# Patient Record
Sex: Female | Born: 1990 | Race: White | Hispanic: No | Marital: Single | Smoking: Current every day smoker
Health system: Southern US, Community
[De-identification: ages and names within clinical notes are randomized; demographics above are authoritative.]

## PROBLEM LIST (undated history)

## (undated) DIAGNOSIS — F1120 Opioid dependence, uncomplicated: Secondary | ICD-10-CM

## (undated) HISTORY — PX: NO PAST SURGERIES: SHX2092

---

## 2017-08-31 ENCOUNTER — Inpatient Hospital Stay
Admission: EM | Admit: 2017-08-31 | Payer: Self-pay | Source: Other Acute Inpatient Hospital | Admitting: Obstetrics and Gynecology

## 2017-08-31 ENCOUNTER — Other Ambulatory Visit: Payer: Self-pay

## 2017-08-31 ENCOUNTER — Inpatient Hospital Stay (HOSPITAL_COMMUNITY): Payer: Self-pay

## 2017-08-31 ENCOUNTER — Encounter (HOSPITAL_COMMUNITY): Payer: Self-pay

## 2017-08-31 ENCOUNTER — Inpatient Hospital Stay (HOSPITAL_COMMUNITY)
Admission: AD | Admit: 2017-08-31 | Discharge: 2017-08-31 | DRG: 761 | Disposition: A | Discharge status: Home / Self Care | Payer: Self-pay | Source: Ambulatory Visit | Attending: Obstetrics and Gynecology | Admitting: Obstetrics and Gynecology

## 2017-08-31 DIAGNOSIS — N83209 Unspecified ovarian cyst, unspecified side: Secondary | ICD-10-CM | POA: Diagnosis present

## 2017-08-31 DIAGNOSIS — F17210 Nicotine dependence, cigarettes, uncomplicated: Secondary | ICD-10-CM | POA: Diagnosis present

## 2017-08-31 DIAGNOSIS — D279 Benign neoplasm of unspecified ovary: Secondary | ICD-10-CM

## 2017-08-31 DIAGNOSIS — R1032 Left lower quadrant pain: Secondary | ICD-10-CM | POA: Diagnosis present

## 2017-08-31 DIAGNOSIS — N83202 Unspecified ovarian cyst, left side: Principal | ICD-10-CM | POA: Diagnosis present

## 2017-08-31 DIAGNOSIS — N801 Endometriosis of ovary: Secondary | ICD-10-CM | POA: Diagnosis present

## 2017-08-31 LAB — COMPREHENSIVE METABOLIC PANEL
ALT: 24 U/L (ref 14–54)
ANION GAP: 7 (ref 5–15)
AST: 20 U/L (ref 15–41)
Albumin: 3.2 g/dL — ABNORMAL LOW (ref 3.5–5.0)
Alkaline Phosphatase: 33 U/L — ABNORMAL LOW (ref 38–126)
BUN: 10 mg/dL (ref 6–20)
CHLORIDE: 112 mmol/L — AB (ref 101–111)
CO2: 19 mmol/L — ABNORMAL LOW (ref 22–32)
Calcium: 7.8 mg/dL — ABNORMAL LOW (ref 8.9–10.3)
Creatinine, Ser: 0.46 mg/dL (ref 0.44–1.00)
GFR calc non Af Amer: >60 mL/min (ref >60)
Glucose, Bld: 85 mg/dL (ref 65–99)
POTASSIUM: 3.1 mmol/L — AB (ref 3.5–5.1)
SODIUM: 138 mmol/L (ref 135–145)
Total Bilirubin: 0.6 mg/dL (ref 0.3–1.2)
Total Protein: 5.6 g/dL — ABNORMAL LOW (ref 6.5–8.1)

## 2017-08-31 LAB — CBC WITH DIFFERENTIAL/PLATELET
BASOS ABS: 0 10*3/uL (ref 0.0–0.1)
Basophils Relative: 0 %
Eosinophils Absolute: 0.1 10*3/uL (ref 0.0–0.7)
Eosinophils Relative: 1 %
HCT: 31 % — ABNORMAL LOW (ref 36.0–46.0)
HEMOGLOBIN: 10.9 g/dL — AB (ref 12.0–15.0)
LYMPHS ABS: 4.5 10*3/uL — AB (ref 0.7–4.0)
LYMPHS PCT: 38 %
MCH: 29.9 pg (ref 26.0–34.0)
MCHC: 35.2 g/dL (ref 30.0–36.0)
MCV: 84.9 fL (ref 78.0–100.0)
Monocytes Absolute: 0.6 10*3/uL (ref 0.1–1.0)
Monocytes Relative: 5 %
NEUTROS PCT: 56 %
Neutro Abs: 6.6 10*3/uL (ref 1.7–7.7)
Platelets: 263 10*3/uL (ref 150–400)
RBC: 3.65 MIL/uL — AB (ref 3.87–5.11)
RDW: 15.8 % — ABNORMAL HIGH (ref 11.5–15.5)
WBC: 11.8 10*3/uL — AB (ref 4.0–10.5)

## 2017-08-31 LAB — URINALYSIS, ROUTINE W REFLEX MICROSCOPIC
Bilirubin Urine: NEGATIVE
GLUCOSE, UA: NEGATIVE mg/dL
Hgb urine dipstick: NEGATIVE
KETONES UR: 5 mg/dL — AB
NITRITE: NEGATIVE
PROTEIN: NEGATIVE mg/dL
Specific Gravity, Urine: 1.025 (ref 1.005–1.030)
pH: 5 (ref 5.0–8.0)

## 2017-08-31 LAB — RAPID URINE DRUG SCREEN, HOSP PERFORMED
AMPHETAMINES: NOT DETECTED
BENZODIAZEPINES: NOT DETECTED
Barbiturates: NOT DETECTED
COCAINE: NOT DETECTED
OPIATES: POSITIVE — AB
Tetrahydrocannabinol: POSITIVE — AB

## 2017-08-31 LAB — HCG, QUANTITATIVE, PREGNANCY: hCG, Beta Chain, Quant, S: <1 m[IU]/mL (ref <5)

## 2017-08-31 LAB — LIPASE, BLOOD: LIPASE: 27 U/L (ref 11–51)

## 2017-08-31 LAB — TYPE AND SCREEN
ABO/RH(D): A POS
Antibody Screen: NEGATIVE

## 2017-08-31 LAB — SEDIMENTATION RATE: SED RATE: 4 mm/h (ref 0–22)

## 2017-08-31 LAB — ABO/RH: ABO/RH(D): A POS

## 2017-08-31 MED ORDER — NORGESTIMATE-ETH ESTRADIOL 0.25-35 MG-MCG PO TABS: 1.0000 | ORAL_TABLET | Freq: Every day | ORAL | 11 refills | Status: AC

## 2017-08-31 MED ORDER — DOXYCYCLINE HYCLATE 100 MG PO TABS
100.0000 mg | ORAL_TABLET | Freq: Two times a day (BID) | ORAL | Status: DC
  Administered 2017-08-31: 100 mg via ORAL
  Filled 2017-08-31: qty 1

## 2017-08-31 MED ORDER — OXYCODONE-ACETAMINOPHEN 5-325 MG PO TABS: 1.0000 | ORAL_TABLET | ORAL | 0 refills | Status: AC | PRN

## 2017-08-31 MED ORDER — ONDANSETRON HCL 4 MG PO TABS: 4.0000 mg | ORAL_TABLET | Freq: Four times a day (QID) | ORAL | Status: DC | PRN

## 2017-08-31 MED ORDER — PRENATAL MULTIVITAMIN CH: 1.0000 | ORAL_TABLET | Freq: Every day | ORAL | Status: DC

## 2017-08-31 MED ORDER — ONDANSETRON HCL 4 MG PO TABS: 4.0000 mg | ORAL_TABLET | Freq: Four times a day (QID) | ORAL | 0 refills | Status: AC | PRN

## 2017-08-31 MED ORDER — ACETAMINOPHEN 325 MG PO TABS: 650.0000 mg | ORAL_TABLET | ORAL | Status: DC | PRN

## 2017-08-31 MED ORDER — HYDROMORPHONE HCL 1 MG/ML IJ SOLN
1.0000 mg | INTRAMUSCULAR | Status: DC | PRN
  Administered 2017-08-31 (×2): 1 mg via INTRAVENOUS
  Filled 2017-08-31 (×2): qty 1

## 2017-08-31 MED ORDER — ONDANSETRON HCL 4 MG/2ML IJ SOLN
4.0000 mg | Freq: Four times a day (QID) | INTRAMUSCULAR | Status: DC | PRN
  Administered 2017-08-31: 4 mg via INTRAVENOUS
  Filled 2017-08-31: qty 2

## 2017-08-31 MED ORDER — CEFTRIAXONE SODIUM 2 G IJ SOLR
2.0000 g | INTRAMUSCULAR | Status: DC
  Administered 2017-08-31: 2 g via INTRAVENOUS
  Filled 2017-08-31: qty 20

## 2017-08-31 NOTE — Discharge Summary (Signed)
Physician Discharge Summary  Patient ID: Cassandra Moss MRN: [id number] DOB/AGE: 11-11-1990 26 y.o.  Admit date: 08/31/2017 Discharge date: 08/31/2017  Admission Diagnoses:rule out ovarian torsion  Discharge Diagnoses:  Active Problems:   Ovarian cyst probable endometriosis  Discharged Condition: good  Hospital Course: * Signed      Expand All Collapse All      [] Hide copied text  [] Hover for details   Cassandra Moss is an 27 y.o. female. She is a G0P0, lmp Feb 1-4, single partner, same sex, who has been having severe LLQ pain, unable to keep anything down x 4 days. She is transferred here from outside ED at [facility], [facility], to rule out adnexal torsion. She has been having episodes of lower abd pain since January 25, when she first was seen at [facility] ED where she was told she had "cysts"  She returned to the ED [facility] on 08/29/17, was seen and CT done, noting a dermoid cyst of the left ovary and bilateral cysts. The pain persisted and pt was taken by her partner to [facility], and then referred here. No u/s availability at that hospital. The pt reports that " [facility] and [facility] were full' , which would certainly be unusual.           **  Consults: None and gynecology  Significant Diagnostic Studies: labs:  CBC    Component Value Date/Time   WBC 11.8 (H) 08/31/2017 0416   RBC 3.65 (L) 08/31/2017 0416   HGB 10.9 (L) 08/31/2017 0416   HCT 31.0 (L) 08/31/2017 0416   PLT 263 08/31/2017 0416   MCV 84.9 08/31/2017 0416   MCH 29.9 08/31/2017 0416   MCHC 35.2 08/31/2017 0416   RDW 15.8 (H) 08/31/2017 0416   LYMPHSABS 4.5 (H) 08/31/2017 0416   MONOABS 0.6 08/31/2017 0416   EOSABS 0.1 08/31/2017 0416   BASOSABS 0.0 08/31/2017 0416    and radiology: Ultrasound: CLINICAL DATA:  Severe acute abdominal pain. History of ovarian CLINICAL DATA:  Severe acute abdominal pain. History of ovarian cysts and possible LEFT  dermoid.  EXAM: TRANSABDOMINAL AND TRANSVAGINAL ULTRASOUND OF PELVIS  TECHNIQUE: Both transabdominal and transvaginal ultrasound examinations of the pelvis were performed. Transabdominal technique was performed for global imaging of the pelvis including uterus, ovaries, adnexal regions, and pelvic cul-de-sac. It was necessary to proceed with endovaginal exam following the transabdominal exam to visualize the adnexa.  COMPARISON:  Pelvic ultrasound August 29, 2017 and CT abdomen and pelvis August 29, 2017 and pelvic ultrasound Dec 07, 2016  FINDINGS: Uterus  Measurements: 6.4 x 4.1 x 4.6 cm. No fibroids or other mass visualized.  Endometrium  Thickness: 9 mm.  No focal abnormality visualized.  Right ovary  Measurements: 4.6 x 2.8 x 2.6 cm. Stable 2.1 cm hypoechoic mass with increased through transmission. Subcentimeter collapsing probable dominant follicle.  Left ovary  Measurements: 4.8 x 3 x 6 cm. Mildly hypoechoic 2.5 x 2.6 cm stable LEFT adnexal mass with subcentimeter echogenic component without acoustic shadowing. Adjacent echogenic 2.4 x 1.6 cm mass is similar with acoustic enhancement. Additional smaller complex cysts with acoustic enhancement. No vascular solid components.  Other findings  No abnormal free fluid. Normal by at lateral adnexa collar flow and spectral waveforms.  IMPRESSION: 1. No sonographic findings of ovarian torsion. 2. Multiple bilateral hypoechoic adnexal masses with imaging characteristics of hemorrhagic cysts or endometriomas, abscess not excluded. Again recommended is follow-up pelvic ultrasound in 6-12 weeks. 3. 9 mm endometrial stripe,  previously 20 mm.   Electronically Signed   By: Elon Alas M.D.   On: 08/31/2017 04:32  Treatments: surgery: analgesics start ocp's  Discharge Exam: Blood pressure (!) 103/48, pulse 71, temperature 98.4 F (36.9 C), temperature source Oral, resp. rate 16, height  5' (1.524 m), weight 116 lb (52.6 kg), last menstrual period 08/14/2017, SpO2 100 %. General appearance: alert, cooperative, no distress and mild distress Head: Normocephalic, without obvious abnormality, atraumatic GI: abnormal findings:  guarding in the llq no rebound  Disposition: Final discharge disposition not confirmed home  Discharge Instructions    Diet - low sodium heart healthy   Complete by:  As directed    Increase activity slowly   Complete by:  As directed      Allergies as of 08/31/2017      Reactions   Ibuprofen Nausea And Vomiting   Toradol [ketorolac Tromethamine] Nausea And Vomiting   Tramadol Hcl Nausea And Vomiting      Medication List    TAKE these medications   norgestimate-ethinyl estradiol 0.25-35 MG-MCG tablet Commonly known as:  ORTHO-CYCLEN,SPRINTEC,PREVIFEM Take 1 tablet by mouth daily. Take the active pills only, and take continuously   ondansetron 4 MG tablet Commonly known as:  ZOFRAN Take 1 tablet (4 mg total) by mouth every 6 (six) hours as needed for nausea.   oxyCODONE-acetaminophen 5-325 MG tablet Commonly known as:  PERCOCET/ROXICET Take 1-2 tablets by mouth every 4 (four) hours as needed for moderate pain or severe pain.      Follow-up Information    [facility] Follow up.           Signed: [name] 08/31/2017, 7:46 AM

## 2017-08-31 NOTE — Progress Notes (Signed)
Subjective: hosp day 0 for admission for w/u to rule out adnexal torsion. Pt referred in from ED at [facility], travelled by POV. Patient reports nausea with tramadol and toradol. Pt has been sleeping since u/s and iv dilaudid.    Objective: I have reviewed patient's vital signs, labs and radiology results. CBC    Component Value Date/Time   WBC 11.8 (H) 08/31/2017 0416   RBC 3.65 (L) 08/31/2017 0416   HGB 10.9 (L) 08/31/2017 0416   HCT 31.0 (L) 08/31/2017 0416   PLT 263 08/31/2017 0416   MCV 84.9 08/31/2017 0416   MCH 29.9 08/31/2017 0416   MCHC 35.2 08/31/2017 0416   RDW 15.8 (H) 08/31/2017 0416   LYMPHSABS 4.5 (H) 08/31/2017 0416   MONOABS 0.6 08/31/2017 0416   EOSABS 0.1 08/31/2017 0416   BASOSABS 0.0 08/31/2017 0416   CMP     Component Value Date/Time   NA 138 08/31/2017 0416   K 3.1 (L) 08/31/2017 0416   CL 112 (H) 08/31/2017 0416   CO2 19 (L) 08/31/2017 0416   GLUCOSE 85 08/31/2017 0416   BUN 10 08/31/2017 0416   CREATININE 0.46 08/31/2017 0416   CALCIUM 7.8 (L) 08/31/2017 0416   PROT 5.6 (L) 08/31/2017 0416   ALBUMIN 3.2 (L) 08/31/2017 0416   AST 20 08/31/2017 0416   ALT 24 08/31/2017 0416   ALKPHOS 33 (L) 08/31/2017 0416   BILITOT 0.6 08/31/2017 0416   GFRNONAA >60 08/31/2017 0416   GFRAA >60 08/31/2017 0416   Lipase normal . U/s NO evidence of torsion. Bilateral cysts that may be small endometriomas, or less likely dermoid, in small ovaries less than 5 cm maximum diameter. Ovaries with internal flow,ruling out torsion General: alert, cooperative, appears stated age, distracted and sleep but arousable Resp: clear to auscultation bilaterally GI: soft, non-tender; bowel sounds normal; no masses,  no organomegaly, normal findings: bowel sounds normal and abnormal findings:  guarding Vaginal Bleeding: none  Guarding in llq only no rebound   Assessment/Plan: Pelvic pain, suspect endometriosis. Will send home on OCP, analgesics, zofran, after SW consult to be  sure pt has $$ for meds.  Pt need to establish a Ob Gyn caregiver, advised to consider [facility].   LOS: 0 days    [name] 08/31/2017, 7:28 AM

## 2017-08-31 NOTE — Discharge Instructions (Signed)
Begin oral contraceptives, and take the active pills only(the 21 that are the same.), beginning the new pack at the time of completion of the last pack's active pills. If you have endometriosis, this will suppress it over time(months.) Endometriosis Endometriosis is a condition in which the tissue that lines the uterus (endometrium) grows outside of its normal location. The tissue may grow in many locations close to the uterus, but it commonly grows on the ovaries, fallopian tubes, vagina, or bowel. When the uterus sheds the endometrium every menstrual cycle, there is bleeding wherever the endometrial tissue is located. This can cause pain because blood is irritating to tissues that are not normally exposed to it. What are the causes? The cause of endometriosis is not known. What increases the risk? You may be more likely to develop endometriosis if you:  Have a family history of endometriosis.  Have never given birth.  Started your period at age 39 or younger.  Have high levels of estrogen in your body.  Were exposed to a certain medicine (diethylstilbestrol) before you were born (in utero).  Had low birth weight.  Were born as a twin, triplet, or other multiple.  Have a BMI of less than 25. BMI is an estimate of body fat and is calculated from height and weight.  What are the signs or symptoms? Often, there are no symptoms of this condition. If you do have symptoms, they may:  Vary depending on where your endometrial tissue is growing.  Occur during your menstrual period (most common) or midcycle.  Come and go, or you may go months with no symptoms at all.  Stop with menopause.  Symptoms may include:  Pain in the back or abdomen.  Heavier bleeding during periods.  Pain during sex.  Painful bowel movements.  Infertility.  Pelvic pain.  Bleeding more than once a month.  How is this diagnosed? This condition is diagnosed based on your symptoms and a physical exam.  You may have tests, such as:  Blood tests and urine tests. These may be done to help rule out other possible causes of your symptoms.  Ultrasound, to look for abnormal tissues.  An X-ray of the lower bowel (barium enema).  An ultrasound that is done through the vagina (transvaginally).  CT scan.  MRI.  Laparoscopy. In this procedure, a lighted, pencil-sized instrument called a laparoscope is inserted into your abdomen through an incision. The laparoscope allows your health care provider to look at the organs inside your body and check for abnormal tissue to confirm the diagnosis. If abnormal tissue is found, your health care provider may remove a small piece of tissue (biopsy) to be examined under a microscope.  How is this treated? Treatment for this condition may include:  Medicines to relieve pain, such as NSAIDs.  Hormone therapy. This involves using artificial (synthetic) hormones to reduce endometrial tissue growth. Your health care provider may recommend using a hormonal form of birth control, or other medicines.  Surgery. This may be done to remove abnormal endometrial tissue. ? In some cases, tissue may be removed using a laparoscope and a laser (laparoscopic laser treatment). ? In severe cases, surgery may be done to remove the fallopian tubes, uterus, and ovaries (hysterectomy).  Follow these instructions at home:  Take over-the-counter and prescription medicines only as told by your health care provider.  Do not drive or use heavy machinery while taking prescription pain medicine.  Try to avoid activities that cause pain, including sexual activity.  Keep all follow-up visits as told by your health care provider. This is important. Contact a health care provider if:  You have pain in the area between your hip bones (pelvic area) that occurs: ? Before, during, or after your period. ? In between your period and gets worse during your period. ? During or after  sex. ? With bowel movements or urination, especially during your period.  You have problems getting pregnant.  You have a fever. Get help right away if:  You have severe pain that does not get better with medicine.  You have severe nausea and vomiting, or you cannot eat without vomiting.  You have pain that affects only the lower, right side of your abdomen.  You have abdominal pain that gets worse.  You have abdominal swelling.  You have blood in your stool. This information is not intended to replace advice given to you by your health care provider. Make sure you discuss any questions you have with your health care provider. Document Released: 07/01/2000 Document Revised: 04/08/2016 Document Reviewed: 12/05/2015 Elsevier Interactive Patient Education  [employer].

## 2017-08-31 NOTE — H&P (Signed)
Cassandra Moss is an 27 y.o. female. She is a G0P0, lmp Feb 1-4, single partner, same sex, who has been having severe LLQ pain, unable to keep anything down x 4 days. She is transferred here from outside ED at [facility], [facility], to rule out adnexal torsion. She has been having episodes of lower abd pain since January 25, when she first was seen at [facility] ED where she was told she had "cysts"  She returned to the ED [facility] on 08/29/17, was seen and CT done, noting a dermoid cyst of the left ovary and bilateral cysts. The pain persisted and pt was taken by her partner to [facility], and then referred here. No u/s availability at that hospital. The pt reports that " [facility] and [facility] were full' , which would certainly be unusual.   Pertinent Gynecological History: Menses: flow is moderate LMP Feb 1-4 Bleeding: none at present Contraception: No female partner DES exposure: unknown Blood transfusions: none Sexually transmitted diseases: no past history Previous GYN Procedures:   Last mammogram:  Date:  Last pap:  Date:  OB History: G0, P0   Menstrual History: Menarche age:  Patient's last menstrual period was 08/14/2017 (exact date).    History reviewed. No pertinent past medical history.  Past Surgical History:  Procedure Laterality Date  . NO PAST SURGERIES      Family History  Problem Relation Age of Onset  . Diabetes Father     Social History:  reports that she has been smoking cigarettes.  She has a 1.50 pack-year smoking history. she has never used smokeless tobacco. She reports that she does not drink alcohol or use drugs.  Allergies:  Allergies  Allergen Reactions  . Ibuprofen Nausea And Vomiting  . Toradol [Ketorolac Tromethamine] Nausea And Vomiting  . Tramadol Hcl Nausea And Vomiting    No medications prior to admission.    ROS  Blood pressure (!) 103/48, pulse 71, temperature 98.4 F (36.9 C), temperature source Oral, resp. rate 16,  height 5' (1.524 m), weight 116 lb (52.6 kg), last menstrual period 08/14/2017, SpO2 100 %. Physical Exam  Constitutional: She appears well-developed and well-nourished. She appears distressed.  Uncomfortable , writhing slightly, c/o llq pain  HENT:  Head: Normocephalic.  Eyes: Pupils are equal, round, and reactive to light.  Neck: Neck supple.  Cardiovascular: Normal rate.  Respiratory: Effort normal.  GI: Soft. She exhibits no mass. There is tenderness. There is guarding. There is no rebound.  Bowel sounds normal pitch slightly hypoactive  extremities: nontender, neg homans.  U/s ordered , in process No results found.  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  Cassandra Moss 08/31/2017, 3:42 AM

## 2017-08-31 NOTE — Progress Notes (Signed)
Pt discharged with printed instructions. Pt verbalized an understanding. Pt refused case management help for obtaining medications. No concerns noted. Toya Smothers, RN

## 2018-08-17 IMAGING — US US PELV - US TRANSVAGINAL
1 series · 15 of 25 positions shown · non-contrast
Comparison: Pelvic ultrasound August 29, 2017 and CT abdomen and
pelvis August 29, 2017 and pelvic ultrasound December 07, 2016

CLINICAL DATA: Severe acute abdominal pain. History of ovarian
cysts and possible LEFT dermoid.



[Series 1: us pelv - us transvaginal · 15 of 76 slices shown]
[im 1/76]
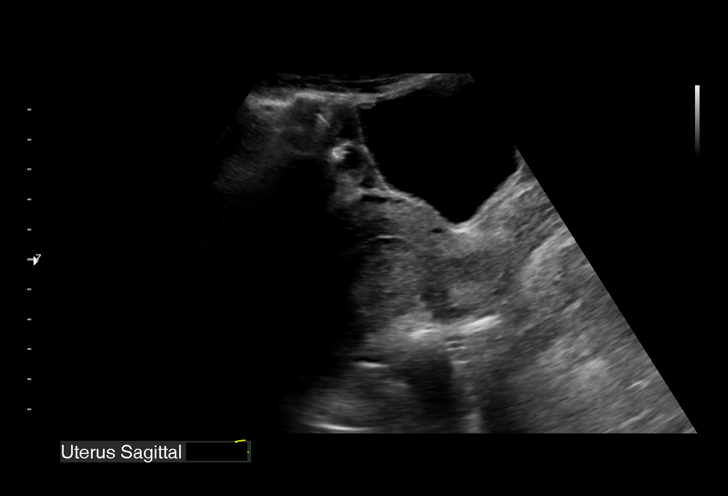
[im 7/76]
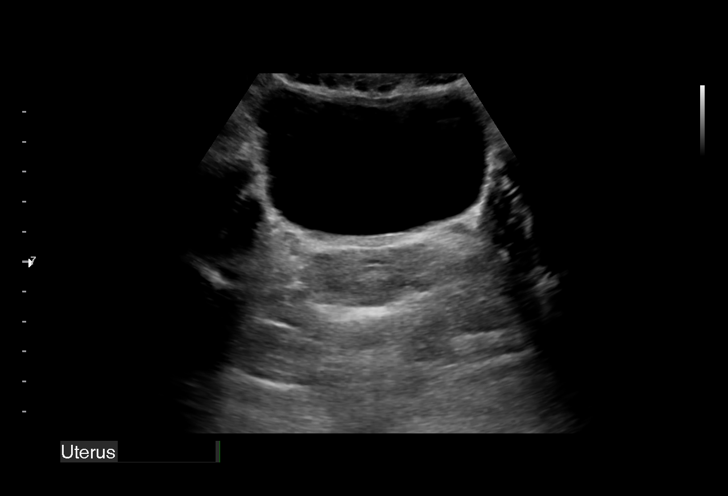
[im 13/76]
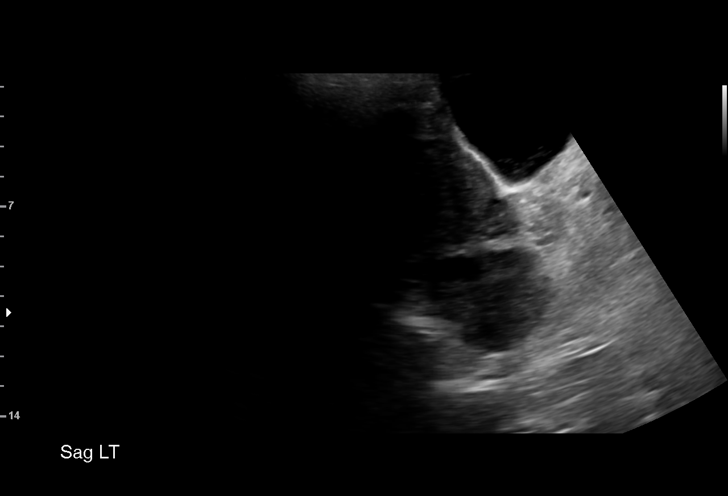
[im 16/76]
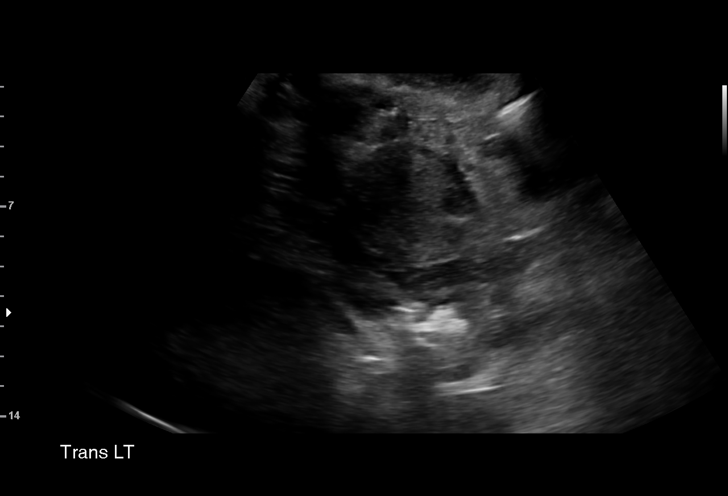
[im 22/76]
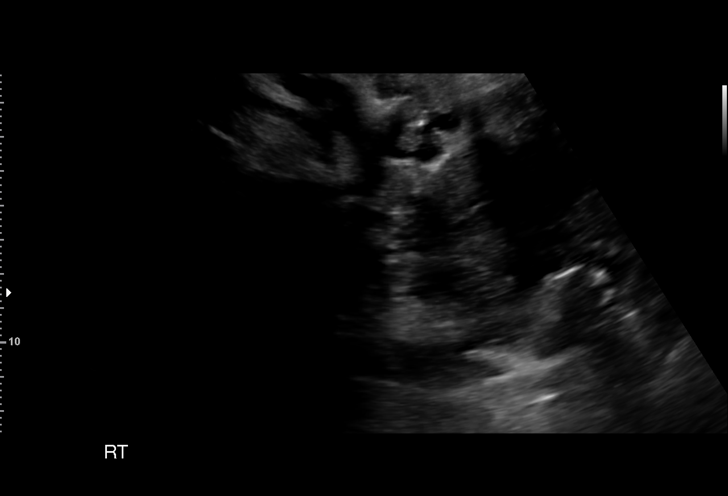
[im 29/76]
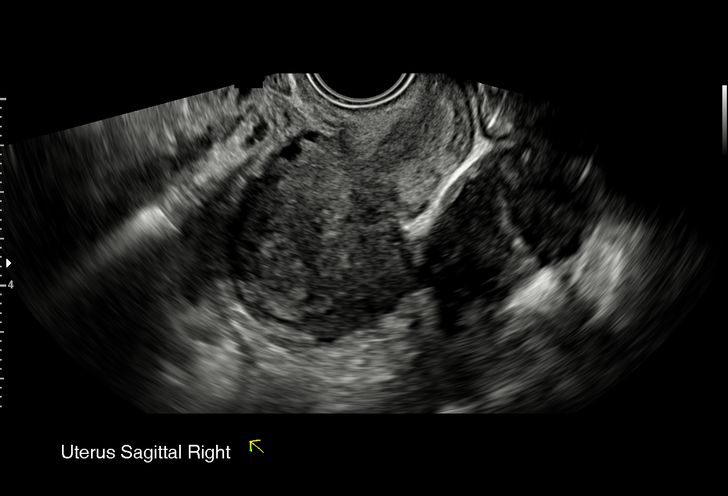
[im 32/76]
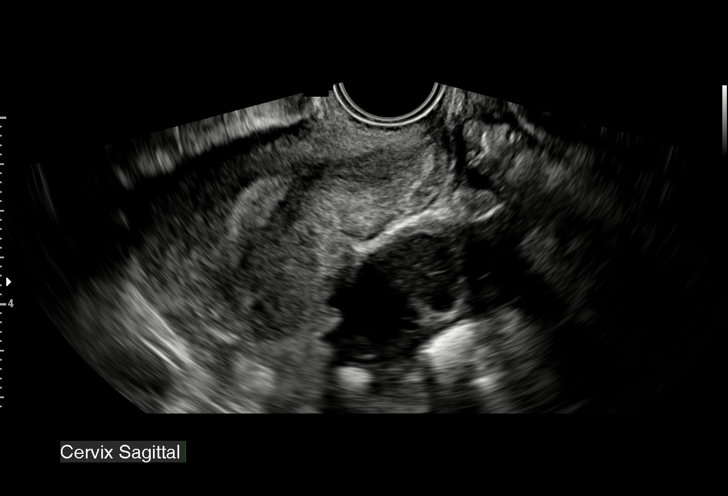
[im 38/76]
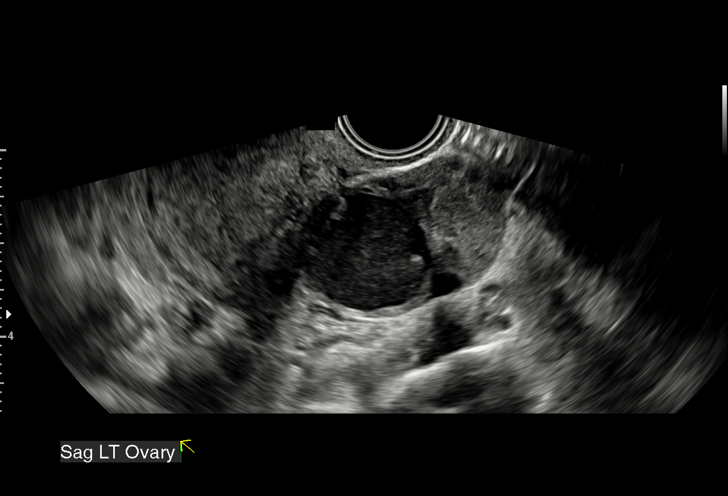
[im 44/76]
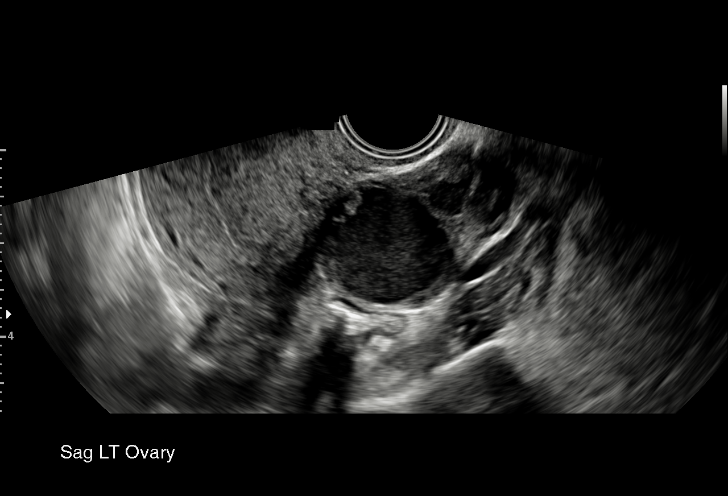
[im 47/76]
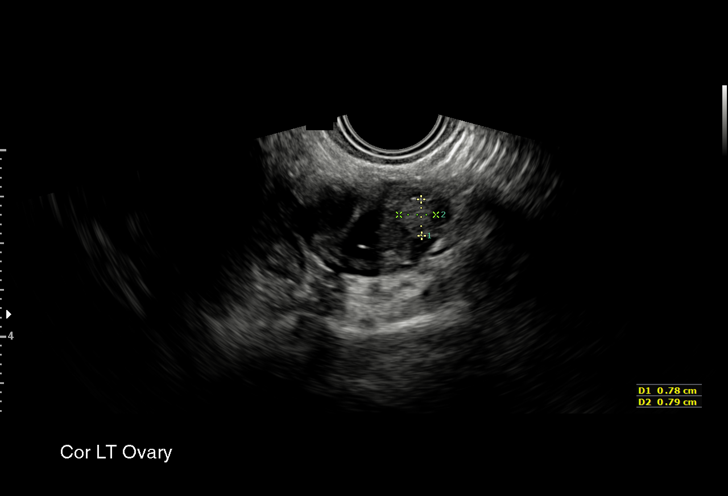
[im 54/76]
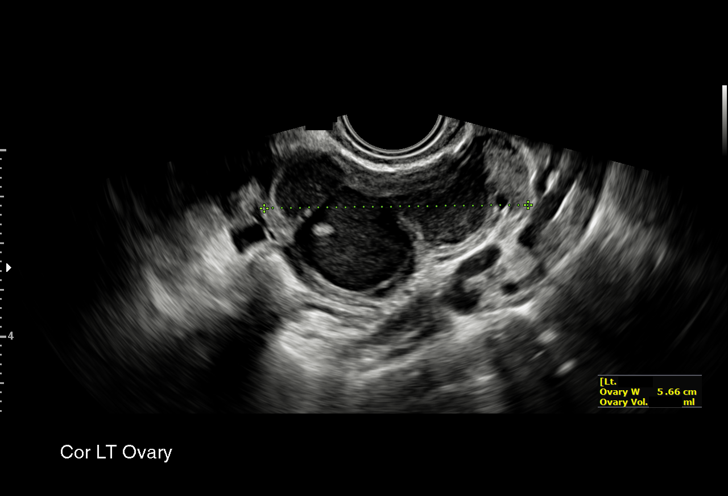
[im 60/76]
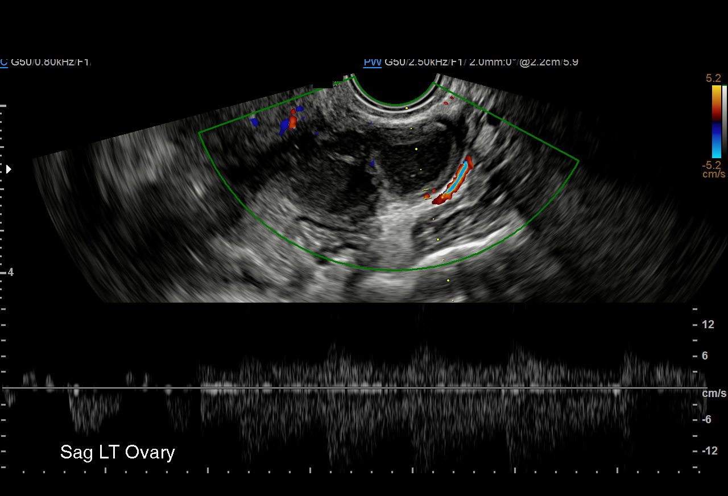
[im 63/76]
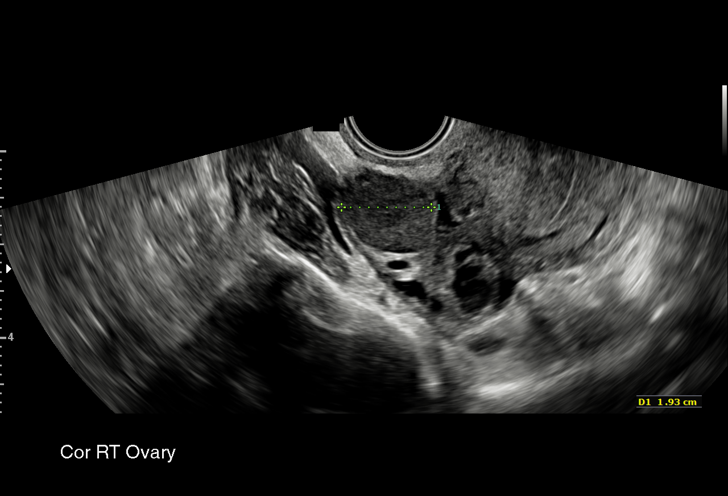
[im 69/76]
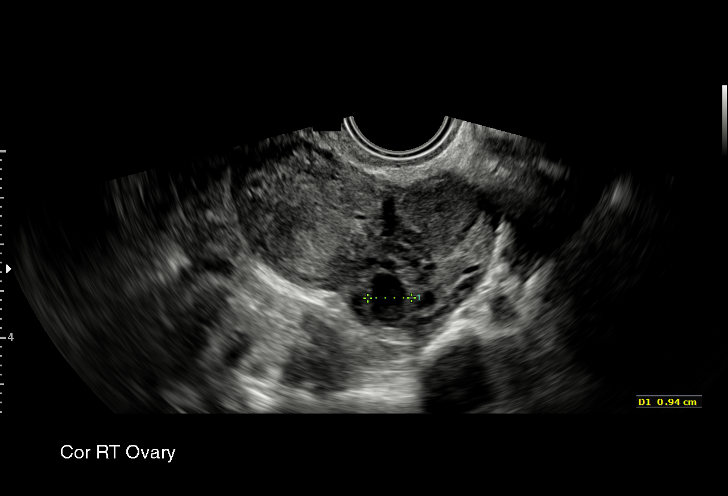
[im 76/76]
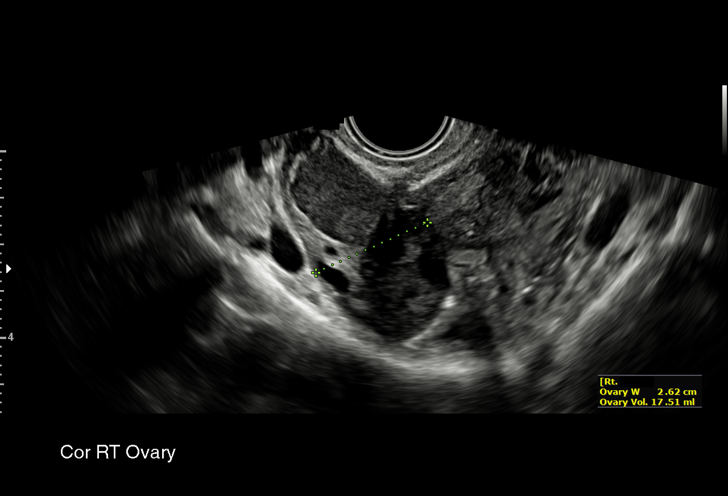

[15 of 25 positions shown; findings below may reference images not displayed]

FINDINGS: Uterus

Measurements: 6.4 x 4.1 x 4.6 cm. No fibroids or other mass
visualized.

Endometrium

Thickness: 9 mm.  No focal abnormality visualized.

Right ovary

Measurements: 4.6 x 2.8 x 2.6 cm. Stable 2.1 cm hypoechoic mass with
increased through transmission. Subcentimeter collapsing probable
dominant follicle.

Left ovary

Measurements: 4.8 x 3 x 6 cm. Mildly hypoechoic 2.5 x 2.6 cm stable
LEFT adnexal mass with subcentimeter echogenic component without
acoustic shadowing. Adjacent echogenic 2.4 x 1.6 cm mass is similar
with acoustic enhancement. Additional smaller complex cysts with
acoustic enhancement. No vascular solid components.

Other findings

No abnormal free fluid. Normal by at lateral adnexa collar flow and
spectral waveforms.
IMPRESSION: 1. No sonographic findings of ovarian torsion.
2. Multiple bilateral hypoechoic adnexal masses with imaging
characteristics of hemorrhagic cysts or endometriomas, abscess not
excluded. Again recommended is follow-up pelvic ultrasound in 6-12
weeks.
3. 9 mm endometrial stripe, previously 20 mm.

## 2018-12-11 ENCOUNTER — Emergency Department (HOSPITAL_COMMUNITY)
Admission: EM | Admit: 2018-12-11 | Discharge: 2018-12-12 | Discharge status: Against Medical Advice | Payer: Self-pay | Attending: Emergency Medicine | Admitting: Emergency Medicine

## 2018-12-11 ENCOUNTER — Encounter (HOSPITAL_COMMUNITY): Payer: Self-pay | Admitting: Emergency Medicine

## 2018-12-11 ENCOUNTER — Other Ambulatory Visit: Payer: Self-pay

## 2018-12-11 DIAGNOSIS — Z5321 Procedure and treatment not carried out due to patient leaving prior to being seen by health care provider: Secondary | ICD-10-CM | POA: Insufficient documentation

## 2018-12-11 HISTORY — DX: Opioid dependence, uncomplicated: F11.20

## 2018-12-11 MED ORDER — OXYCODONE HCL 5 MG PO TABS
5.0000 mg | ORAL_TABLET | Freq: Once | ORAL | Status: AC
  Administered 2018-12-11: 5 mg via ORAL
  Filled 2018-12-11: qty 1

## 2018-12-11 NOTE — ED Triage Notes (Signed)
Pt reports she was clearing gutters from roof when she fell from the ladder and landed on her back 3 days ago. Pt reports it was approximately 4-5 feet. Pt ambulatory in waiting room. Pt denies hitting her head. Pt reports pain is constant to mid back. Pt took naproxen when it happened but no other medications.

## 2018-12-12 NOTE — ED Notes (Signed)
Pt seen leaving emergency department, did not notify staff if she was leaving or coming back.
